# Patient Record
Sex: Male | Born: 1970 | Race: Black or African American | Hispanic: No | Marital: Single | Smoking: Current every day smoker
Health system: Southern US, Community
[De-identification: ages and names within clinical notes are randomized; demographics above are authoritative.]

---

## 2012-03-01 ENCOUNTER — Emergency Department (HOSPITAL_COMMUNITY): Payer: 59

## 2012-03-01 ENCOUNTER — Encounter (HOSPITAL_COMMUNITY): Payer: Self-pay | Admitting: *Deleted

## 2012-03-01 ENCOUNTER — Emergency Department (HOSPITAL_COMMUNITY)
Admission: EM | Admit: 2012-03-01 | Discharge: 2012-03-01 | Disposition: A | Discharge status: Home / Self Care | Payer: 59 | Attending: Emergency Medicine | Admitting: Emergency Medicine

## 2012-03-01 DIAGNOSIS — R062 Wheezing: Secondary | ICD-10-CM | POA: Insufficient documentation

## 2012-03-01 DIAGNOSIS — J40 Bronchitis, not specified as acute or chronic: Secondary | ICD-10-CM

## 2012-03-01 DIAGNOSIS — R0602 Shortness of breath: Secondary | ICD-10-CM | POA: Insufficient documentation

## 2012-03-01 MED ORDER — BENZONATATE 100 MG PO CAPS
200.0000 mg | ORAL_CAPSULE | ORAL | Status: AC
  Administered 2012-03-01: 200 mg via ORAL
  Filled 2012-03-01: qty 1

## 2012-03-01 MED ORDER — ALBUTEROL SULFATE (5 MG/ML) 0.5% IN NEBU
5.0000 mg | INHALATION_SOLUTION | Freq: Once | RESPIRATORY_TRACT | Status: AC
  Administered 2012-03-01: 5 mg via RESPIRATORY_TRACT
  Filled 2012-03-01: qty 1

## 2012-03-01 MED ORDER — ALBUTEROL SULFATE HFA 108 (90 BASE) MCG/ACT IN AERS: 2.0000 | INHALATION_SPRAY | RESPIRATORY_TRACT | Status: DC | PRN

## 2012-03-01 NOTE — Discharge Instructions (Signed)
 RESOURCE GUIDE  Dental Problems  Patients with Medicaid: [facility]                     [facility] [address]                                           [address] Phone:  [phone]                                                  Phone:  [phone]  If unable to pay or uninsured, contact:  [facility] or [facility] to become qualified for the adult dental clinic.  Chronic Pain Problems Contact [facility]  [phone] Patients need to be referred by their primary care doctor.  Insufficient Money for Medicine Contact [employer]:  call "211" or [facility] 271-5999.  No Primary Care Doctor Call [facility]  832-8000 Other agencies that provide inexpensive medical care    [facility]  [phone]    [facility]  [phone]    [facility]  [phone]    [facility]  [phone]    [facility]  [phone]    [facility]  [phone]  Psychological Services [facility]  [phone] [facility]  [phone] [facility]   800 853-5163 (emergency services 641-4993)  Substance Abuse Resources [facility]  336-882-2125 [facility] 336-784-9470 [facility] 336-285-9073 [facility] 336-845-3988 Residential & Outpatient Substance Abuse Program  800-659-3381  Abuse/Neglect [place] Child Abuse Hotline (336) 641-3795 [place] Child Abuse Hotline 800-378-5315 (After Hours)  Emergency Shelter [facility] (336) 271-5985  [facility] (336) 275-9566 [facility] (704) 372-4663  MRSA Hotline #:   832-7006    [place] [facility]  [facility] [place]     [employer]                          [facility] [address] [place]                       [address]      [address]  [place]                                                [place]                            [place] Phone:  [phone]                                   Phone:  [phone]                 Phone:  [phone]  [facility] Phone:    [phone]  [place] Child Abuse Hotline (336) 342-1394 (336) 342-3537 (After Hours)   

## 2012-03-01 NOTE — ED Provider Notes (Signed)
History     CSN: [id number]  Arrival date & time 03/01/12  0017   First MD Initiated Contact with Patient 03/01/12 [phone]      Chief Complaint  Patient presents with  . Shortness of Breath    (Consider location/radiation/quality/duration/timing/severity/associated sxs/prior treatment) HPI Comments: Patient is an otherwise healthy 41 year old male with a history of tobacco use who presents with 3 days of gradual onset of occasional cough and wheezing. He has times where he feels normal and times where he feels like he has difficulty breathing. Over the last several hours while trying to lay down tonight he has had increased cough increased wheezing and shortness of breath and feels like he cannot catch his breath when he lays flat. He denies any swelling of the legs, trauma, travel, immobilization, hormone therapy use, history of venous thromboembolism. He denies any fevers chills nausea vomiting chest pain back pain abdominal pain. The symptoms are mild, worse when he lays down. He has no history of asthma or bronchospasm  Patient is a 41 y.o. male presenting with shortness of breath. The history is provided by the patient.  Shortness of Breath  Associated symptoms include shortness of breath.    History reviewed. No pertinent past medical history.  History reviewed. No pertinent past surgical history.  No family history on file.  History  Substance Use Topics  . Smoking status: Current Everyday Smoker  . Smokeless tobacco: Not on file  . Alcohol Use: No      Review of Systems  Respiratory: Positive for shortness of breath.   All other systems reviewed and are negative.    Allergies  Review of patient's allergies indicates no known allergies.  Home Medications   Current Outpatient Rx  Name Route Sig Dispense Refill  . ALBUTEROL SULFATE HFA 108 (90 BASE) MCG/ACT IN AERS Inhalation Inhale 2 puffs into the lungs every 4 (four) hours as needed for wheezing or shortness of  breath. 1 Inhaler 3    Dispense with a spacer please    BP 122/74  Pulse 74  Temp(Src) 98.2 F (36.8 C) (Oral)  Resp 20  SpO2 98%  Physical Exam  Nursing note and vitals reviewed. Constitutional: He appears well-developed and well-nourished. No distress.  HENT:  Head: Normocephalic and atraumatic.  Mouth/Throat: Oropharynx is clear and moist. No oropharyngeal exudate.       Pharynx and ears are clear, there is no erythema exudate or asymmetry or hypertrophy in the oropharynx.  Eyes: Conjunctivae and EOM are normal. Pupils are equal, round, and reactive to light. Right eye exhibits no discharge. Left eye exhibits no discharge. No scleral icterus.  Neck: Normal range of motion. Neck supple. No JVD present. No thyromegaly present.       No Lymph adenopathy, normal range of motion of the neck  Cardiovascular: Normal rate, regular rhythm, normal heart sounds and intact distal pulses.  Exam reveals no gallop and no friction rub.   No murmur heard.      Normal pulses in all 4 extremities  Pulmonary/Chest: Effort normal. No respiratory distress. He has wheezes. He has no rales.  Abdominal: Soft. Bowel sounds are normal. He exhibits no distension and no mass. There is no tenderness.  Musculoskeletal: Normal range of motion. He exhibits no edema and no tenderness.       No edema of the lower extremities  Lymphadenopathy:    He has no cervical adenopathy.  Neurological: He is alert. Coordination normal.  Alert, follows commands, normal mental status, goal oriented speech  Skin: Skin is warm and dry. No rash noted. No erythema.       No rashes  Psychiatric: He has a normal mood and affect. His behavior is normal.    ED Course  Procedures (including critical care time)  Labs Reviewed - No data to display Dg Chest 2 View  03/01/2012  *RADIOLOGY REPORT*  Clinical Data: Cough with wheezing  CHEST - 2 VIEW  Comparison: None.  Findings: Normal heart size with clear lung fields.  No bony  abnormality.  IMPRESSION: Negative chest.  Original Report Authenticated By: Elsie Stain, M.D.     1. Bronchitis       MDM  The patient appears to be having bronchospasm, question reactive airway disease with a history of tobacco abuse, he does not indoors any risk factors for pulmonary embolism other than smoking, has oxygen saturations 100% on room air during my exam, no respiratory distress and no tachycardia. I believe him to be low risk for pulmonary embolism is much more likely to be reactive airway disease or bronchospasm. Albuterol ordered, chest x-ray ordered to rule out pneumonia or slight pneumothorax.  Discharge Prescriptions include:  Albuterol MDI        Vida Roller, MD 03/01/12 [phone]

## 2012-03-01 NOTE — ED Notes (Signed)
He woke up coughing and having difficulty breathing and he cannot breathe from his nose

## 2013-08-05 IMAGING — CR DG CHEST 2V
2 series · 2 of 2 positions shown · non-contrast
Comparison: None.

CLINICAL DATA: Cough with wheezing

CHEST - 2 VIEW

[w chest pa]
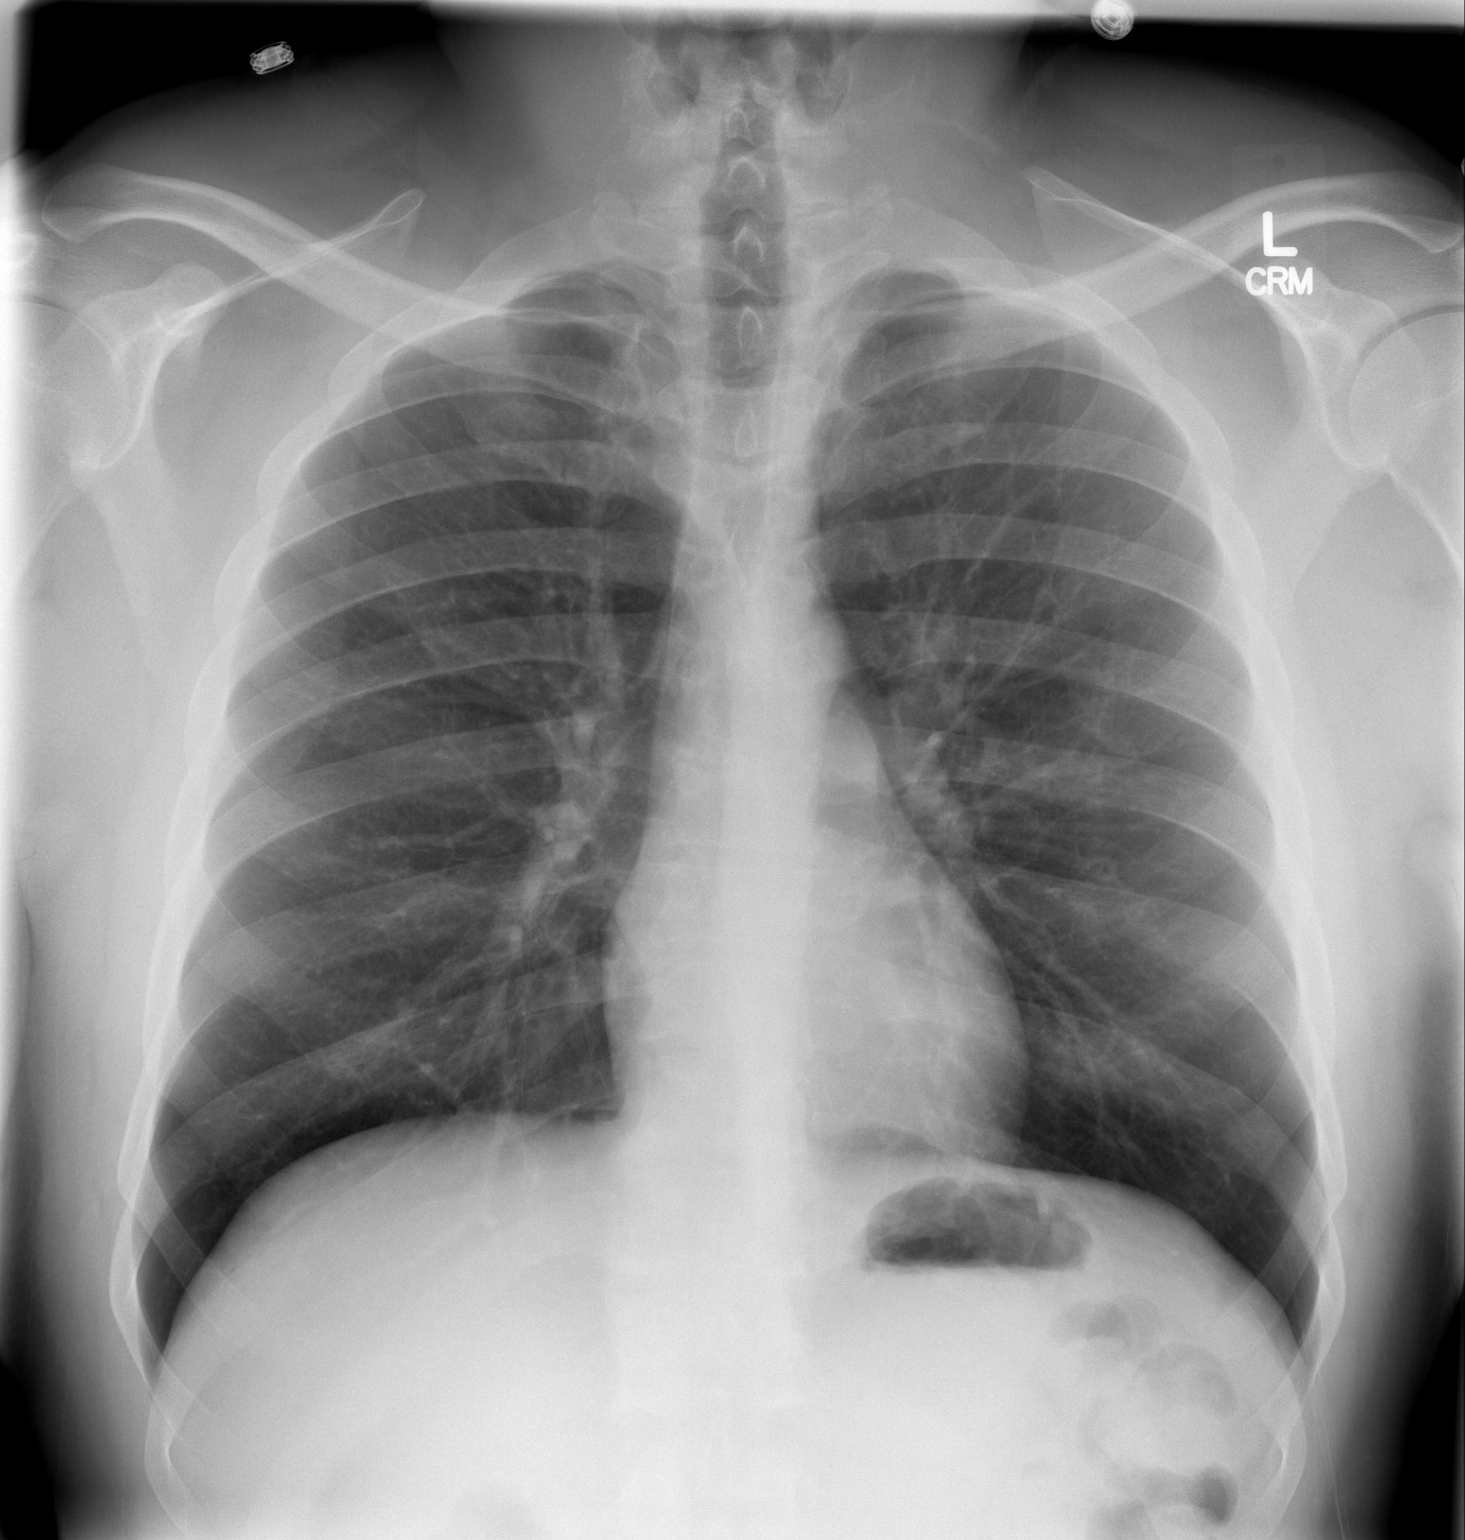

[w chest lat]
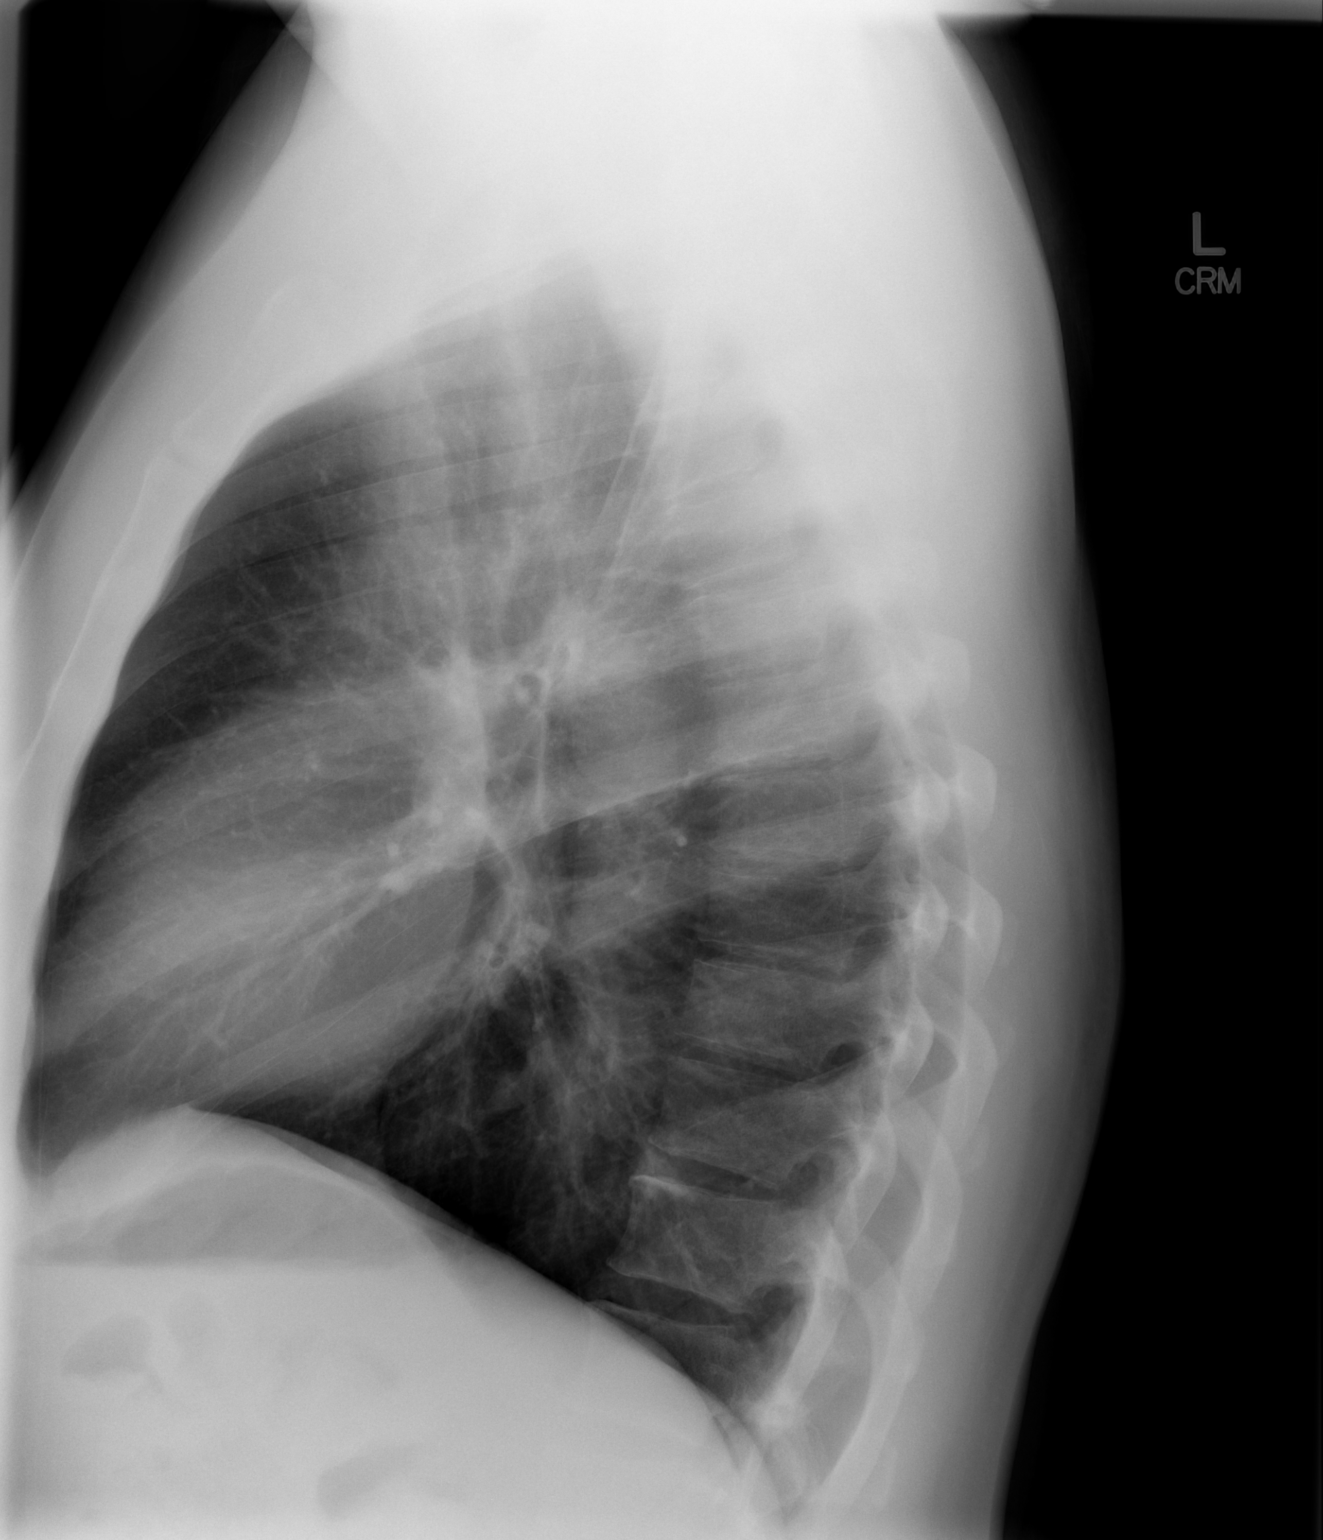

[2 of 2 positions shown; findings below may reference images not displayed]

FINDINGS: Normal heart size with clear lung fields.  No bony
abnormality.
IMPRESSION: Negative chest.

## 2015-11-15 ENCOUNTER — Telehealth (HOSPITAL_BASED_OUTPATIENT_CLINIC_OR_DEPARTMENT_OTHER): Payer: Self-pay | Admitting: Emergency Medicine

## 2015-11-15 ENCOUNTER — Emergency Department (HOSPITAL_COMMUNITY)
Admission: EM | Admit: 2015-11-15 | Discharge: 2015-11-15 | Disposition: A | Discharge status: Home / Self Care | Payer: Self-pay | Attending: Emergency Medicine | Admitting: Emergency Medicine

## 2015-11-15 ENCOUNTER — Encounter (HOSPITAL_COMMUNITY): Payer: Self-pay | Admitting: Emergency Medicine

## 2015-11-15 DIAGNOSIS — Z202 Contact with and (suspected) exposure to infections with a predominantly sexual mode of transmission: Secondary | ICD-10-CM | POA: Insufficient documentation

## 2015-11-15 DIAGNOSIS — F17200 Nicotine dependence, unspecified, uncomplicated: Secondary | ICD-10-CM | POA: Insufficient documentation

## 2015-11-15 LAB — GC/CHLAMYDIA PROBE AMP (~~LOC~~) NOT AT ARMC
Chlamydia: NEGATIVE
Neisseria Gonorrhea: NEGATIVE

## 2015-11-15 LAB — HIV ANTIBODY (ROUTINE TESTING W REFLEX): HIV SCREEN 4TH GENERATION: NONREACTIVE

## 2015-11-15 LAB — RPR: RPR: NONREACTIVE

## 2015-11-15 MED ORDER — METRONIDAZOLE 500 MG PO TABS: 500.0000 mg | ORAL_TABLET | Freq: Three times a day (TID) | ORAL | Status: AC

## 2015-11-15 MED ORDER — LIDOCAINE HCL (PF) 1 % IJ SOLN
5.0000 mL | Freq: Once | INTRAMUSCULAR | Status: AC
  Administered 2015-11-15: 5 mL
  Filled 2015-11-15: qty 5

## 2015-11-15 MED ORDER — METRONIDAZOLE 500 MG PO TABS
500.0000 mg | ORAL_TABLET | Freq: Once | ORAL | Status: AC
  Administered 2015-11-15: 500 mg via ORAL
  Filled 2015-11-15: qty 1

## 2015-11-15 MED ORDER — CEFTRIAXONE SODIUM 250 MG IJ SOLR
250.0000 mg | Freq: Once | INTRAMUSCULAR | Status: AC
Start: 2015-11-15 — End: 2015-11-15
  Administered 2015-11-15: 250 mg via INTRAMUSCULAR
  Filled 2015-11-15: qty 250

## 2015-11-15 MED ORDER — AZITHROMYCIN 1 G PO PACK
1.0000 g | PACK | Freq: Once | ORAL | Status: AC
  Administered 2015-11-15: 1 g via ORAL
  Filled 2015-11-15: qty 1

## 2015-11-15 NOTE — ED Provider Notes (Signed)
CSN: [id number]     Arrival date & time 11/15/15  0603 History   First MD Initiated Contact with Patient 11/15/15 [phone]     Chief Complaint  Patient presents with  . Exposure to STD     (Consider location/radiation/quality/duration/timing/severity/associated sxs/prior Treatment) Patient is a 45 y.o. male presenting with STD exposure.  Exposure to STD This is a new problem. The problem occurs every several days. Pertinent negatives include no chest pain. Nothing aggravates the symptoms. Nothing relieves the symptoms. He has tried nothing for the symptoms. The treatment provided no relief.    History reviewed. No pertinent past medical history. History reviewed. No pertinent past surgical history. No family history on file. Social History  Substance Use Topics  . Smoking status: Current Every Day Smoker  . Smokeless tobacco: None  . Alcohol Use: No    Review of Systems  Constitutional: Negative for fever and chills.  Eyes: Negative for photophobia.  Cardiovascular: Negative for chest pain.  Genitourinary: Negative for hematuria, discharge, scrotal swelling, genital sores and penile pain.  Musculoskeletal: Negative for back pain and arthralgias.  Skin: Negative for rash.  All other systems reviewed and are negative.     Allergies  Review of patient's allergies indicates no known allergies.  Home Medications   Prior to Admission medications   Medication Sig Start Date End Date Taking? Authorizing Provider  metroNIDAZOLE (FLAGYL) 500 MG tablet Take 1 tablet (500 mg total) by mouth 3 (three) times daily. 11/15/15   Barbara Cower Aneshia Jacquet, MD   BP 148/90 mmHg  Pulse 89  Temp(Src) 97.7 F (36.5 C) (Oral)  Ht  (1.854 m)  Wt 225 lb (102.059 kg)  BMI 29.69 kg/m2  SpO2 100% Physical Exam  Constitutional: He is oriented to person, place, and time. He appears well-developed and well-nourished.  HENT:  Head: Normocephalic and atraumatic.  Neck: Normal range of motion.   Cardiovascular: Normal rate.   Pulmonary/Chest: Effort normal. No respiratory distress.  Abdominal: He exhibits no distension.  Genitourinary: Penis normal. No penile tenderness.  Musculoskeletal: Normal range of motion. He exhibits no edema or tenderness.  Neurological: He is alert and oriented to person, place, and time. No cranial nerve deficit.  Skin: Skin is warm and dry. No rash noted. No erythema.  Nursing note and vitals reviewed.   ED Course  Procedures (including critical care time) Labs Review Labs Reviewed  RPR  HIV ANTIBODY (ROUTINE TESTING)  GC/CHLAMYDIA PROBE AMP ([place]) NOT AT [facility]    Imaging Review No results found. I have personally reviewed and evaluated these images and lab results as part of my medical decision-making.   EKG Interpretation None      MDM   Final diagnoses:  STD exposure    45 year old male with exposure to Trichomonas from his wife. She states monogamy and trusts his wife however will look in that aspect further. No evidence of complication. No symptoms to be consistent with infection however we'll treat prophylactically for GC chlamydia and also start him on her buttocks for Trichomonas. Also sent RPR and HIV.    Marily Memos, MD 11/15/15 0730

## 2015-11-15 NOTE — ED Notes (Signed)
Patient reports that spouse called him and reported that she was told she has a sexually transmitted disease. Patient wants to be checked out. Denies any symptoms.

## 2015-11-19 ENCOUNTER — Telehealth (HOSPITAL_COMMUNITY): Payer: Self-pay

## 2020-06-22 ENCOUNTER — Other Ambulatory Visit: Payer: Self-pay

## 2020-06-22 ENCOUNTER — Encounter (HOSPITAL_COMMUNITY): Payer: Self-pay | Admitting: Emergency Medicine

## 2020-06-22 ENCOUNTER — Emergency Department (HOSPITAL_COMMUNITY)
Admission: EM | Admit: 2020-06-22 | Discharge: 2020-06-22 | Disposition: A | Discharge status: Home / Self Care | Payer: Self-pay | Attending: Emergency Medicine | Admitting: Emergency Medicine

## 2020-06-22 DIAGNOSIS — F17210 Nicotine dependence, cigarettes, uncomplicated: Secondary | ICD-10-CM | POA: Insufficient documentation

## 2020-06-22 DIAGNOSIS — R1013 Epigastric pain: Secondary | ICD-10-CM | POA: Insufficient documentation

## 2020-06-22 LAB — COMPREHENSIVE METABOLIC PANEL
ALT: 22 U/L (ref 0–44)
AST: 22 U/L (ref 15–41)
Albumin: 3.8 g/dL (ref 3.5–5.0)
Alkaline Phosphatase: 55 U/L (ref 38–126)
Anion gap: 10 (ref 5–15)
BUN: 11 mg/dL (ref 6–20)
CO2: 24 mmol/L (ref 22–32)
Calcium: 9.1 mg/dL (ref 8.9–10.3)
Chloride: 104 mmol/L (ref 98–111)
Creatinine, Ser: 0.93 mg/dL (ref 0.61–1.24)
GFR calc Af Amer: >60 mL/min (ref >60)
GFR calc non Af Amer: >60 mL/min (ref >60)
Glucose, Bld: 102 mg/dL — ABNORMAL HIGH (ref 70–99)
Potassium: 3.3 mmol/L — ABNORMAL LOW (ref 3.5–5.1)
Sodium: 138 mmol/L (ref 135–145)
Total Bilirubin: 0.9 mg/dL (ref 0.3–1.2)
Total Protein: 7 g/dL (ref 6.5–8.1)

## 2020-06-22 LAB — CBC
HCT: 46.3 % (ref 39.0–52.0)
Hemoglobin: 15.6 g/dL (ref 13.0–17.0)
MCH: 28.1 pg (ref 26.0–34.0)
MCHC: 33.7 g/dL (ref 30.0–36.0)
MCV: 83.4 fL (ref 80.0–100.0)
Platelets: 299 10*3/uL (ref 150–400)
RBC: 5.55 MIL/uL (ref 4.22–5.81)
RDW: 15.6 % — ABNORMAL HIGH (ref 11.5–15.5)
WBC: 6.7 10*3/uL (ref 4.0–10.5)
nRBC: 0 % (ref 0.0–0.2)

## 2020-06-22 LAB — URINALYSIS, ROUTINE W REFLEX MICROSCOPIC
Bilirubin Urine: NEGATIVE
Glucose, UA: NEGATIVE mg/dL
Hgb urine dipstick: NEGATIVE
Ketones, ur: NEGATIVE mg/dL
Leukocytes,Ua: NEGATIVE
Nitrite: NEGATIVE
Protein, ur: NEGATIVE mg/dL
Specific Gravity, Urine: 1.014 (ref 1.005–1.030)
pH: 5 (ref 5.0–8.0)

## 2020-06-22 LAB — POC OCCULT BLOOD, ED: Fecal Occult Bld: NEGATIVE

## 2020-06-22 LAB — LIPASE, BLOOD: Lipase: 25 U/L (ref 11–51)

## 2020-06-22 MED ORDER — OMEPRAZOLE 20 MG PO CPDR: 20.0000 mg | DELAYED_RELEASE_CAPSULE | Freq: Two times a day (BID) | ORAL | 0 refills | Status: AC

## 2020-06-22 NOTE — Discharge Instructions (Signed)
Your recurrent abdominal pain after eating may be due to heartburn.  Please take Prilosec 30 minutes before each meal as it will help with your discomfort.  However, if you develop fever, worsening abdominal pain, vomiting, please return to the ER for further evaluation.

## 2020-06-22 NOTE — ED Triage Notes (Signed)
Pt reports generalized abd pain that starts 2-3 hours after eating since Thursday.  States it feels like he has to have a BM but unable to.  States he had a BM on Thursday after using enema but none since then.  Also reports nausea.  Denies vomiting.

## 2020-06-22 NOTE — ED Provider Notes (Signed)
[facility] EMERGENCY DEPARTMENT Provider Note   CSN: [id number] Arrival date & time: 06/22/20  4656     History Chief Complaint  Patient presents with  . Abdominal Pain    Willie King is a 49 y.o. male.  The history is provided by the patient and medical records. No language interpreter was used.  Abdominal Pain    49 year old male presents ED for evaluation of abdominal pain.  Patient report for the past 2 to 3 days he has been having recurrent postprandial pain.  He mention after eating he would experiencing abdominal cramping.  Pain localized to his epigastric region and is left lower abdomen lasting for few hours, intense and then will resolved.  It typically happen after eating which is unusual for him.  Patient stated he felt a bit constipated and he did try to use an enema with minimal relief.  He endorsed occasional bouts of nausea without vomiting.  He does not complain of any fever or chills no chest pain or shortness of breath no abnormal taste in mouth, no runny nose sneezing coughing loss of taste smell no dysuria hematuria.  No prior abdominal surgeries.  No active pain at this time.  No change in his diet.  He denies alcohol abuse.  He has not had vaccination for COVID-19.  History reviewed. No pertinent past medical history.  There are no problems to display for this patient.   History reviewed. No pertinent surgical history.     No family history on file.  Social History   Tobacco Use  . Smoking status: Current Every Day Smoker  . Smokeless tobacco: Never Used  Substance Use Topics  . Alcohol use: Yes  . Drug use: Yes    Types: Marijuana    Home Medications Prior to Admission medications   Medication Sig Start Date End Date Taking? Authorizing Provider  metroNIDAZOLE (FLAGYL) 500 MG tablet Take 1 tablet (500 mg total) by mouth 3 (three) times daily. 11/15/15   Mesner, Barbara Cower, MD  albuterol (PROVENTIL HFA;VENTOLIN HFA) 108 (90  BASE) MCG/ACT inhaler Inhale 2 puffs into the lungs every 4 (four) hours as needed for wheezing or shortness of breath. Patient not taking: Reported on 11/15/2015 03/01/12 11/15/15  Eber Hong, MD    Allergies    Patient has no known allergies.  Review of Systems   Review of Systems  Gastrointestinal: Positive for abdominal pain.  All other systems reviewed and are negative.   Physical Exam Updated Vital Signs BP 125/85 (BP Location: Right Arm)   Pulse 66   Temp 98.9 F (37.2 C) (Oral)   Resp 17   Ht 6\' 1"  (1.854 m)   Wt 104.3 kg   SpO2 100%   BMI 30.34 kg/m   Physical Exam Vitals and nursing note reviewed. Exam conducted with a chaperone present.  Constitutional:      General: He is not in acute distress.    Appearance: He is well-developed.  HENT:     Head: Atraumatic.  Eyes:     Conjunctiva/sclera: Conjunctivae normal.  Cardiovascular:     Rate and Rhythm: Normal rate and regular rhythm.     Heart sounds: Normal heart sounds.  Pulmonary:     Effort: Pulmonary effort is normal.     Breath sounds: Normal breath sounds. No wheezing, rhonchi or rales.  Abdominal:     General: Abdomen is flat. Bowel sounds are normal.     Palpations: Abdomen is soft.     Tenderness:  There is no abdominal tenderness. There is no right CVA tenderness, left CVA tenderness, guarding or rebound. Negative signs include Murphy's sign, Rovsing's sign and McBurney's sign.     Hernia: No hernia is present.  Genitourinary:    Comments: Jeannett Senior, RN, was available to chaperone.  Normal rectal tone, no obvious mass, normal color stool, fecal occult blood test negative. Musculoskeletal:     Cervical back: Neck supple.  Skin:    Findings: No rash.  Neurological:     Mental Status: He is alert and oriented to person, place, and time.  Psychiatric:        Mood and Affect: Mood normal.     ED Results / Procedures / Treatments   Labs (all labs ordered are listed, but only abnormal results are  displayed) Labs Reviewed  COMPREHENSIVE METABOLIC PANEL - Abnormal; Notable for the following components:      Result Value   Potassium 3.3 (*)    Glucose, Bld 102 (*)    All other components within normal limits  CBC - Abnormal; Notable for the following components:   RDW 15.6 (*)    All other components within normal limits  LIPASE, BLOOD  URINALYSIS, ROUTINE W REFLEX MICROSCOPIC  POC OCCULT BLOOD, ED    EKG None  Radiology No results found.  Procedures Procedures (including critical care time)  Medications Ordered in ED Medications - No data to display  ED Course  I have reviewed the triage vital signs and the nursing notes.  Pertinent labs & imaging results that were available during my care of the patient were reviewed by me and considered in my medical decision making (see chart for details).    MDM Rules/Calculators/A&P                          BP 125/85 (BP Location: Right Arm)   Pulse 66   Temp 98.9 F (37.2 C) (Oral)   Resp 17   Ht 6\' 1"  (1.854 m)   Wt 104.3 kg   SpO2 100%   BMI 30.34 kg/m   Final Clinical Impression(s) / ED Diagnoses Final diagnoses:  Epigastric pain    Rx / DC Orders ED Discharge Orders         Ordered    omeprazole (PRILOSEC) 20 MG capsule  2 times daily before meals        06/22/20 1249         12:09 PM Patient report having intermittent postprandial pain for the past few days.  He denies having any active pain at this time.  His abdomen is soft and nontender on exam.  No leukocytosis, normal lipase, normal electrolytes panel, UA without signs of urinary tract infection.  At this time no immediate acute pathology identified.  Suspect elements of gastritis/GERD causing his postprandial pain.  Doubt gallbladder etiology, doubt diverticular disease.  Will provide PPI and encourage patient to follow-up with PCP or GI specialist for further care.  I also have low suspicion for SBO.     06/24/20, PA-C 06/22/20 1251      06/24/20, MD 06/22/20 2103

## 2023-07-05 ENCOUNTER — Emergency Department (HOSPITAL_COMMUNITY): Payer: BC Managed Care – PPO

## 2023-07-05 ENCOUNTER — Encounter (HOSPITAL_COMMUNITY): Payer: Self-pay | Admitting: Emergency Medicine

## 2023-07-05 ENCOUNTER — Emergency Department (HOSPITAL_COMMUNITY)
Admission: EM | Admit: 2023-07-05 | Discharge: 2023-07-05 | Disposition: A | Discharge status: Home / Self Care | Payer: BC Managed Care – PPO | Attending: Emergency Medicine | Admitting: Emergency Medicine

## 2023-07-05 DIAGNOSIS — M79651 Pain in right thigh: Secondary | ICD-10-CM | POA: Insufficient documentation

## 2023-07-05 MED ORDER — KETOROLAC TROMETHAMINE 15 MG/ML IJ SOLN
15.0000 mg | Freq: Once | INTRAMUSCULAR | Status: AC
  Administered 2023-07-05: 15 mg via INTRAMUSCULAR
  Filled 2023-07-05: qty 1

## 2023-07-05 MED ORDER — METHOCARBAMOL 500 MG PO TABS: 500.0000 mg | ORAL_TABLET | Freq: Two times a day (BID) | ORAL | 0 refills | Status: AC

## 2023-07-05 MED ORDER — ACETAMINOPHEN 500 MG PO TABS
1000.0000 mg | ORAL_TABLET | Freq: Once | ORAL | Status: AC
  Administered 2023-07-05: 1000 mg via ORAL
  Filled 2023-07-05: qty 2

## 2023-07-05 MED ORDER — OXYCODONE HCL 5 MG PO TABS
5.0000 mg | ORAL_TABLET | Freq: Once | ORAL | Status: AC
  Administered 2023-07-05: 5 mg via ORAL
  Filled 2023-07-05: qty 1

## 2023-07-05 NOTE — ED Provider Notes (Signed)
[place] EMERGENCY DEPARTMENT AT [facility] Provider Note   CSN: [id number] Arrival date & time: 07/05/23  1057     History  Chief Complaint  Patient presents with   Leg Pain    Willie King is a 52 y.o. male.  52 yo M with a chief complaints of right thigh pain.  This has been going on for the past few days.  Pain mostly to the posterior aspect of the thigh but also in the anterior.  Worse with ambulation bearing weight.  Denies trauma.  Denies loss of bowel or bladder denies loss perirectal sensation is numbness or weakness to the leg.   Leg Pain      Home Medications Prior to Admission medications   Medication Sig Start Date End Date Taking? Authorizing Provider  methocarbamol (ROBAXIN) 500 MG tablet Take 1 tablet (500 mg total) by mouth 2 (two) times daily. 07/05/23  Yes Melene Plan, DO  metroNIDAZOLE (FLAGYL) 500 MG tablet Take 1 tablet (500 mg total) by mouth 3 (three) times daily. 11/15/15   Mesner, Barbara Cower, MD  omeprazole (PRILOSEC) 20 MG capsule Take 1 capsule (20 mg total) by mouth 2 (two) times daily before a meal. 06/22/20   Fayrene Helper, PA-C  albuterol (PROVENTIL HFA;VENTOLIN HFA) 108 (90 BASE) MCG/ACT inhaler Inhale 2 puffs into the lungs every 4 (four) hours as needed for wheezing or shortness of breath. Patient not taking: Reported on 11/15/2015 03/01/12 11/15/15  Eber Hong, MD      Allergies    Patient has no known allergies.    Review of Systems   Review of Systems  Physical Exam Updated Vital Signs BP (!) 141/94 (BP Location: Left Arm)   Pulse 83   Temp 98.6 F (37 C) (Oral)   Resp 18   SpO2 97%  Physical Exam Vitals and nursing note reviewed.  Constitutional:      Appearance: He is well-developed.  HENT:     Head: Normocephalic and atraumatic.  Eyes:     Pupils: Pupils are equal, round, and reactive to light.  Neck:     Vascular: No JVD.  Cardiovascular:     Rate and Rhythm: Normal rate and regular rhythm.     Heart sounds:  No murmur heard.    No friction rub. No gallop.  Pulmonary:     Effort: No respiratory distress.     Breath sounds: No wheezing.  Abdominal:     General: There is no distension.     Tenderness: There is no abdominal tenderness. There is no guarding or rebound.  Musculoskeletal:        General: Normal range of motion.     Cervical back: Normal range of motion and neck supple.     Comments: PMS intact distally.  Has some discomfort about the hamstring.  No obvious edema.  Skin:    Coloration: Skin is not pale.     Findings: No rash.  Neurological:     Mental Status: He is alert and oriented to person, place, and time.  Psychiatric:        Behavior: Behavior normal.     ED Results / Procedures / Treatments   Labs (all labs ordered are listed, but only abnormal results are displayed) Labs Reviewed - No data to display  EKG None  Radiology DG Hip Unilat W or Wo Pelvis 2-3 Views Right  Result Date: 07/05/2023 CLINICAL DATA:  Right leg pain without trauma EXAM: DG HIP (WITH OR WITHOUT PELVIS) 2-3V  RIGHT COMPARISON:  None Available. FINDINGS: AP view of the pelvis and AP/frog leg views of the right hip. Femoral heads are located. Sacroiliac joints are symmetric. No acute fracture. Mild bilateral weight-bearing surface hip joint space narrowing and subchondral sclerosis. IMPRESSION: Mild bilateral hip osteoarthritis.  No acute superimposed process. Electronically Signed   By: Jeronimo Greaves M.D.   On: 07/05/2023 13:25    Procedures Procedures    Medications Ordered in ED Medications  ketorolac (TORADOL) 15 MG/ML injection 15 mg (has no administration in time range)  acetaminophen (TYLENOL) tablet 1,000 mg (has no administration in time range)  oxyCODONE (Oxy IR/ROXICODONE) immediate release tablet 5 mg (has no administration in time range)    ED Course/ Medical Decision Making/ A&P                                 Medical Decision Making Amount and/or Complexity of Data  Reviewed Radiology: ordered.  Risk OTC drugs. Prescription drug management.   52 yo M with a chief complaints of right leg pain.  This been going on for about a week.  Atraumatic.  Pain mostly along the hamstring.  He is some pain to the anterior aspect of the thigh as well.  Plain film independently interpreted by me without obvious fracture or dislocation.  I think most likely has a hamstring strain.  Could also be sciatica.  Will treat supportively.  PCP follow-up.  2:08 PM:  I have discussed the diagnosis/risks/treatment options with the patient.  Evaluation and diagnostic testing in the emergency department does not suggest an emergent condition requiring admission or immediate intervention beyond what has been performed at this time.  They will follow up with PCP. We also discussed returning to the ED immediately if new or worsening sx occur. We discussed the sx which are most concerning (e.g., sudden worsening pain, fever, inability to tolerate by mouth) that necessitate immediate return. Medications administered to the patient during their visit and any new prescriptions provided to the patient are listed below.  Medications given during this visit Medications  ketorolac (TORADOL) 15 MG/ML injection 15 mg (has no administration in time range)  acetaminophen (TYLENOL) tablet 1,000 mg (has no administration in time range)  oxyCODONE (Oxy IR/ROXICODONE) immediate release tablet 5 mg (has no administration in time range)     The patient appears reasonably screen and/or stabilized for discharge and I doubt any other medical condition or other [facility] requiring further screening, evaluation, or treatment in the ED at this time prior to discharge.          Final Clinical Impression(s) / ED Diagnoses Final diagnoses:  Right thigh pain    Rx / DC Orders ED Discharge Orders          Ordered    methocarbamol (ROBAXIN) 500 MG tablet  2 times daily        07/05/23 1402               Melene Plan, DO 07/05/23 1408

## 2023-07-05 NOTE — ED Notes (Signed)
Pt reports 0/10 pain scale to right leg when he is not moving. Pt states on activity, pain is 10/10. Reports pt was walking on Thursday when right leg started hurting with numbness. No trauma or falls.

## 2023-07-05 NOTE — Discharge Instructions (Signed)
Take 4 over the counter ibuprofen tablets 3 times a day or 2 over-the-counter naproxen tablets twice a day for pain. Also take tylenol 1000mg (2 extra strength) four times a day.    Follow up with your family doc.  Return for difficulty urinating, leg numbness.

## 2023-07-05 NOTE — ED Triage Notes (Signed)
Pt here from home with c/o right upper leg pain that started last Thursday was able to work Friday and Sat , pain started in right hip and radiates down the leg
